# Patient Record
Sex: Male | Born: 1990 | Race: Black or African American | Hispanic: No | Marital: Single | Smoking: Current every day smoker
Health system: Southern US, Community
[De-identification: ages and names within clinical notes are randomized; demographics above are authoritative.]

---

## 2010-06-23 ENCOUNTER — Emergency Department (HOSPITAL_COMMUNITY): Admission: EM | Admit: 2010-06-23 | Discharge: 2010-06-23 | Payer: Self-pay | Admitting: Emergency Medicine

## 2016-08-10 ENCOUNTER — Encounter (HOSPITAL_BASED_OUTPATIENT_CLINIC_OR_DEPARTMENT_OTHER): Payer: Self-pay | Admitting: Emergency Medicine

## 2016-08-10 ENCOUNTER — Emergency Department (HOSPITAL_BASED_OUTPATIENT_CLINIC_OR_DEPARTMENT_OTHER)
Admission: EM | Admit: 2016-08-10 | Discharge: 2016-08-10 | Disposition: A | Discharge status: Home / Self Care | Payer: Federal, State, Local not specified - PPO | Attending: Emergency Medicine | Admitting: Emergency Medicine

## 2016-08-10 ENCOUNTER — Emergency Department (HOSPITAL_BASED_OUTPATIENT_CLINIC_OR_DEPARTMENT_OTHER): Payer: Federal, State, Local not specified - PPO

## 2016-08-10 DIAGNOSIS — W19XXXA Unspecified fall, initial encounter: Secondary | ICD-10-CM | POA: Insufficient documentation

## 2016-08-10 DIAGNOSIS — S43401A Unspecified sprain of right shoulder joint, initial encounter: Secondary | ICD-10-CM

## 2016-08-10 DIAGNOSIS — Y929 Unspecified place or not applicable: Secondary | ICD-10-CM | POA: Insufficient documentation

## 2016-08-10 DIAGNOSIS — Y9367 Activity, basketball: Secondary | ICD-10-CM | POA: Insufficient documentation

## 2016-08-10 DIAGNOSIS — F17200 Nicotine dependence, unspecified, uncomplicated: Secondary | ICD-10-CM | POA: Diagnosis not present

## 2016-08-10 DIAGNOSIS — Y998 Other external cause status: Secondary | ICD-10-CM | POA: Diagnosis not present

## 2016-08-10 DIAGNOSIS — S4991XA Unspecified injury of right shoulder and upper arm, initial encounter: Secondary | ICD-10-CM | POA: Diagnosis present

## 2016-08-10 MED ORDER — IBUPROFEN 600 MG PO TABS: 600.0000 mg | ORAL_TABLET | Freq: Four times a day (QID) | ORAL | 0 refills | Status: DC | PRN

## 2016-08-10 MED ORDER — IBUPROFEN 400 MG PO TABS
600.0000 mg | ORAL_TABLET | Freq: Once | ORAL | Status: AC
  Administered 2016-08-10: 600 mg via ORAL
  Filled 2016-08-10: qty 1

## 2016-08-10 NOTE — ED Triage Notes (Signed)
Patient reports that he was playing basketball last night and hurt his right shoulder

## 2016-08-10 NOTE — ED Provider Notes (Signed)
[facility]   CSN: [id number] Arrival date & time: 08/10/16  1611  By signing my name below, I, Linna [id number] Turner, attest that this documentation has been prepared under the direction and in the presence of physician practitioner, Loren Raceravid Anishka Bushard, MD. Electronically Signed: Linna Darnerussell Turner, Scribe. 08/10/2016. 4:28 PM.  History   Chief Complaint Chief Complaint  Patient presents with  . Shoulder Injury    The history is provided by the patient. No language interpreter was used.     HPI Comments: Gerald Sexton is a 25 y.o. male who presents to the Emergency Department complaining of constant, worsening right shoulder pain beginning yesterday. He states he was playing basketball last night and ran into a wall with his right shoulder. He states he went to work after injuring his shoulder and exacerbated the pain with heavy lifting. He reports mild right shoulder pain at rest and severe right shoulder pain with right arm movement or raising. He has tried [employer] with no relief of his right shoulder pain. No prior h/o trauma or injury to his right shoulder. He denies numbness, focal weakness, color change, wounds, or any other associated symptoms.  History reviewed. No pertinent past medical history.  There are no active problems to display for this patient.   History reviewed. No pertinent surgical history.     Home Medications    Prior to Admission medications   Medication Sig Start Date End Date Taking? Authorizing Provider  ibuprofen (ADVIL,MOTRIN) 600 MG tablet Take 1 tablet (600 mg total) by mouth every 6 (six) hours as needed. 08/10/16   Loren Raceravid Kyleigha Markert, MD    Family History No family history on file.  Social History Social History  Substance Use Topics  . Smoking status: Current Every Day Smoker  . Smokeless tobacco: Never Used  . Alcohol use Yes     Comment: socially      Allergies   Patient has no known allergies.   Review of  Systems Review of Systems  Constitutional: Negative for chills and fever.  Cardiovascular: Negative for chest pain.  Musculoskeletal: Positive for arthralgias. Negative for back pain, joint swelling, myalgias and neck pain.  Skin: Negative for rash and wound.  Neurological: Negative for weakness and numbness.  All other systems reviewed and are negative.    Physical Exam Updated Vital Signs BP 121/92 (BP Location: Right Arm)   Pulse 69   Temp 98.9 F (37.2 C) (Oral)   Resp 18   Ht 6\' 5"  (1.956 m)   Wt 194 lb (88 kg)   SpO2 100%   BMI 23.01 kg/m   Physical Exam  Constitutional: He is oriented to person, place, and time. He appears well-developed and well-nourished. No distress.  HENT:  Head: Normocephalic and atraumatic.  Eyes: EOM are normal. Pupils are equal, round, and reactive to light.  Neck: Normal range of motion. Neck supple.  Cardiovascular: Normal rate.   Pulmonary/Chest: Effort normal. He exhibits no tenderness.  Abdominal: Soft. Bowel sounds are normal. There is no tenderness. There is no rebound and no guarding.  Musculoskeletal: Normal range of motion. He exhibits no edema, tenderness or deformity.  No tenderness to palpation along the right clavicle, deltoid or scapula. Pain is exacerbated with abduction and extension of the right shoulder. 2+ distal pulses  Neurological: He is alert and oriented to person, place, and time.  5/5 bilateral upper extremity strength including abduction and grip strength. Sensation fully intact.  Skin: Skin is warm and dry. Capillary  refill takes less than 2 seconds. No rash noted. No erythema.  Psychiatric: He has a normal mood and affect. His behavior is normal.  Nursing note and vitals reviewed.    ED Treatments / Results  Labs (all labs ordered are listed, but only abnormal results are displayed) Labs Reviewed - No data to display  EKG  EKG Interpretation None       Radiology Dg Shoulder Right  Result Date:  08/10/2016 CLINICAL DATA:  Patient status post fall while playing basketball. Right shoulder injury. Initial encounter. EXAM: RIGHT SHOULDER - 2+ VIEW COMPARISON:  None. FINDINGS: There is no evidence of fracture or dislocation. There is no evidence of arthropathy or other focal bone abnormality. Soft tissues are unremarkable. IMPRESSION: No acute osseous abnormality. Electronically Signed   By: Annia Beltrew  Davis M.D.   On: 08/10/2016 17:04    Procedures Procedures (including critical care time)  DIAGNOSTIC STUDIES: Oxygen Saturation is 100% on RA, normal by my interpretation.    COORDINATION OF CARE: 4:32 PM Discussed treatment plan with pt at bedside and pt agreed to plan.  Medications Ordered in ED Medications  ibuprofen (ADVIL,MOTRIN) tablet 600 mg (600 mg Oral Given 08/10/16 1655)     Initial Impression / Assessment and Plan / ED Course  I have reviewed the triage vital signs and the nursing notes.  Pertinent labs & imaging results that were available during my care of the patient were reviewed by me and considered in my medical decision making (see chart for details).  Clinical Course    No acute bony abnormality on x-ray. Will treat with Rice therapy. Advised to follow-up with sports medicine MD.    Final Clinical Impressions(s) / ED Diagnoses   Final diagnoses:  Sprain of right shoulder, unspecified shoulder sprain type, initial encounter    New Prescriptions New Prescriptions   IBUPROFEN (ADVIL,MOTRIN) 600 MG TABLET    Take 1 tablet (600 mg total) by mouth every 6 (six) hours as needed.   I personally performed the services described in this documentation, which was scribed in my presence. The recorded information has been reviewed and is accurate.      Loren Raceravid Bryttany Tortorelli, MD 08/10/16 [phone]

## 2016-08-10 NOTE — ED Notes (Signed)
Pt given d/c instructions as per chart. Rx x 1. Verbalizes understanding. No questions. 

## 2017-07-06 ENCOUNTER — Other Ambulatory Visit: Payer: Self-pay

## 2017-07-06 ENCOUNTER — Encounter (HOSPITAL_BASED_OUTPATIENT_CLINIC_OR_DEPARTMENT_OTHER): Payer: Self-pay

## 2017-07-06 ENCOUNTER — Emergency Department (HOSPITAL_BASED_OUTPATIENT_CLINIC_OR_DEPARTMENT_OTHER)
Admission: EM | Admit: 2017-07-06 | Discharge: 2017-07-06 | Disposition: A | Discharge status: Home / Self Care | Payer: Federal, State, Local not specified - PPO | Attending: Emergency Medicine | Admitting: Emergency Medicine

## 2017-07-06 ENCOUNTER — Emergency Department (HOSPITAL_BASED_OUTPATIENT_CLINIC_OR_DEPARTMENT_OTHER): Payer: Federal, State, Local not specified - PPO

## 2017-07-06 DIAGNOSIS — F17210 Nicotine dependence, cigarettes, uncomplicated: Secondary | ICD-10-CM | POA: Diagnosis not present

## 2017-07-06 DIAGNOSIS — M25571 Pain in right ankle and joints of right foot: Secondary | ICD-10-CM | POA: Insufficient documentation

## 2017-07-06 DIAGNOSIS — M25562 Pain in left knee: Secondary | ICD-10-CM | POA: Insufficient documentation

## 2017-07-06 DIAGNOSIS — Z041 Encounter for examination and observation following transport accident: Secondary | ICD-10-CM | POA: Diagnosis not present

## 2017-07-06 MED ORDER — IBUPROFEN 800 MG PO TABS: 800.0000 mg | ORAL_TABLET | Freq: Three times a day (TID) | ORAL | 0 refills | Status: AC
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
## 2017-07-06 NOTE — ED Notes (Signed)
ED Provider at bedside. 

## 2017-07-06 NOTE — ED Notes (Signed)
Patient transported to X-ray 

## 2017-07-06 NOTE — Discharge Instructions (Signed)
Take ibuprofen 3 times a day with meals.  Do not take other anti-inflammatories at the same time (Advil, Motrin, naproxen, Aleve).  You may supplement with Tylenol if you need further pain control. Use heat or ice if this helps control your pain. You may try the back stretches for symptom control. You will likely have continued muscle pain and stiffness of the next several days.  Follow-up with [facility] and wellness in 1 week if symptoms are not improving. Return to the emergency room if you develop vision changes, slurred speech, persistent vomiting, loss of bowel or bladder control, numbness, or any new or worsening symptoms.
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
